# Patient Record
Sex: Male | Born: 1995 | Race: White | Hispanic: No | Marital: Single | Smoking: Current every day smoker
Health system: Southern US, Community
[De-identification: ages and names within clinical notes are randomized; demographics above are authoritative.]

---
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
## 2016-11-10 ENCOUNTER — Encounter (HOSPITAL_COMMUNITY): Payer: Self-pay | Admitting: *Deleted

## 2016-11-10 ENCOUNTER — Emergency Department (HOSPITAL_COMMUNITY)
Admission: EM | Admit: 2016-11-10 | Discharge: 2016-11-10 | Disposition: A | Discharge status: Home / Self Care | Payer: No Typology Code available for payment source | Attending: Emergency Medicine | Admitting: Emergency Medicine

## 2016-11-10 DIAGNOSIS — S161XXA Strain of muscle, fascia and tendon at neck level, initial encounter: Secondary | ICD-10-CM | POA: Insufficient documentation

## 2016-11-10 DIAGNOSIS — Y999 Unspecified external cause status: Secondary | ICD-10-CM | POA: Diagnosis not present

## 2016-11-10 DIAGNOSIS — Y92410 Unspecified street and highway as the place of occurrence of the external cause: Secondary | ICD-10-CM | POA: Diagnosis not present

## 2016-11-10 DIAGNOSIS — S199XXA Unspecified injury of neck, initial encounter: Secondary | ICD-10-CM | POA: Diagnosis present

## 2016-11-10 DIAGNOSIS — F17210 Nicotine dependence, cigarettes, uncomplicated: Secondary | ICD-10-CM | POA: Diagnosis not present

## 2016-11-10 DIAGNOSIS — Y939 Activity, unspecified: Secondary | ICD-10-CM | POA: Diagnosis not present

## 2016-11-10 DIAGNOSIS — S39012A Strain of muscle, fascia and tendon of lower back, initial encounter: Secondary | ICD-10-CM | POA: Insufficient documentation

## 2016-11-10 MED ORDER — IBUPROFEN 800 MG PO TABS: 800.0000 mg | ORAL_TABLET | Freq: Three times a day (TID) | ORAL | 0 refills | Status: DC

## 2016-11-10 MED ORDER — CYCLOBENZAPRINE HCL 10 MG PO TABS: 10.0000 mg | ORAL_TABLET | Freq: Three times a day (TID) | ORAL | 0 refills | Status: DC | PRN
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
## 2017-12-08 ENCOUNTER — Encounter (HOSPITAL_COMMUNITY): Payer: Self-pay | Admitting: Emergency Medicine

## 2017-12-08 ENCOUNTER — Other Ambulatory Visit: Payer: Self-pay

## 2017-12-08 ENCOUNTER — Emergency Department (HOSPITAL_COMMUNITY)
Admission: EM | Admit: 2017-12-08 | Discharge: 2017-12-09 | Disposition: A | Discharge status: Home / Self Care | Payer: Self-pay | Attending: Emergency Medicine | Admitting: Emergency Medicine

## 2017-12-08 DIAGNOSIS — J40 Bronchitis, not specified as acute or chronic: Secondary | ICD-10-CM | POA: Insufficient documentation

## 2017-12-08 DIAGNOSIS — F17210 Nicotine dependence, cigarettes, uncomplicated: Secondary | ICD-10-CM | POA: Insufficient documentation

## 2017-12-08 MED ORDER — PREDNISONE 20 MG PO TABS
40.0000 mg | ORAL_TABLET | Freq: Once | ORAL | Status: AC
  Administered 2017-12-08: 40 mg via ORAL
  Filled 2017-12-08: qty 2

## 2017-12-08 MED ORDER — ALBUTEROL SULFATE HFA 108 (90 BASE) MCG/ACT IN AERS
2.0000 | INHALATION_SPRAY | Freq: Once | RESPIRATORY_TRACT | Status: AC
  Administered 2017-12-08: 2 via RESPIRATORY_TRACT
  Filled 2017-12-08: qty 6.7

## 2017-12-08 MED ORDER — ALBUTEROL SULFATE (2.5 MG/3ML) 0.083% IN NEBU
2.5000 mg | INHALATION_SOLUTION | Freq: Once | RESPIRATORY_TRACT | Status: AC
  Administered 2017-12-08: 2.5 mg via RESPIRATORY_TRACT
  Filled 2017-12-08: qty 3

## 2017-12-08 MED ORDER — DIPHENHYDRAMINE HCL 12.5 MG/5ML PO ELIX
12.5000 mg | ORAL_SOLUTION | Freq: Once | ORAL | Status: AC
  Administered 2017-12-08: 12.5 mg via ORAL
  Filled 2017-12-08: qty 5

## 2017-12-08 NOTE — ED Triage Notes (Signed)
Pt states since chasing dog earlier tonight he has been sob.

## 2017-12-08 NOTE — ED Notes (Signed)
Pt reporting he feels better 30 secs after giving him his medication. Pt states he is ready to go home.

## 2017-12-08 NOTE — ED Notes (Signed)
Pt states he has been coughing up sputum over the last week.

## 2017-12-08 NOTE — ED Provider Notes (Signed)
[facility] EMERGENCY DEPARTMENT Provider Note   CSN: [id number] Arrival date & time: 12/08/17  2219     History   Chief Complaint Chief Complaint  Patient presents with  . Shortness of Breath    HPI George Mueller is a 21 y.o. male.  Patient is a 21 year old male who presents to the emergency department with cough and sensation of shortness of breath.  Patient states that he has been doing a lot of coughing over the past week.  Today he was chasing the dog and after chasing the dog he felt severely short of breath.  Upon his arrival to the emergency department he states that he felt as though he might pass out, but did not pass out.  He denies coughing up any blood.  He denies any high fever or chills.  He is not had any recent injury or trauma to the chest.  He has no diagnosed lung related issues.  It is of note that he is a smoker.      History reviewed. No pertinent past medical history.  Patient Active Problem List   Diagnosis Date Noted  . Closed fracture distal radius and ulna 07/24/2013  . CLOS FRACTURE MID/PROXIMAL PHALANX/PHALANG HAND 04/30/2010    History reviewed. No pertinent surgical history.     Home Medications    Prior to Admission medications   Medication Sig Start Date End Date Taking? Authorizing Provider  cyclobenzaprine (FLEXERIL) 10 MG tablet Take 1 tablet (10 mg total) by mouth 3 (three) times daily as needed. 11/10/16   Triplett, Babette Relicammy, PA-C  HYDROcodone-acetaminophen (NORCO/VICODIN) 5-325 MG per tablet 1 tab PO q6 hours prn pain 07/15/13   Samuel JesterMcManus, Kathleen, DO  ibuprofen (ADVIL,MOTRIN) 800 MG tablet Take 1 tablet (800 mg total) by mouth 3 (three) times daily. 11/10/16   Pauline Ausriplett, Tammy, PA-C    Family History No family history on file.  Social History Social History   Tobacco Use  . Smoking status: Current Every Day Smoker    Packs/day: 1.00    Types: Cigarettes  . Smokeless tobacco: Never Used  Substance Use Topics  .  Alcohol use: No  . Drug use: No     Allergies   Patient has no known allergies.   Review of Systems Review of Systems  Constitutional: Negative for activity change.       All ROS Neg except as noted in HPI  HENT: Positive for congestion. Negative for nosebleeds.   Eyes: Negative for photophobia and discharge.  Respiratory: Positive for cough, shortness of breath and wheezing.   Cardiovascular: Negative for chest pain and palpitations.  Gastrointestinal: Negative for abdominal pain and blood in stool.  Genitourinary: Negative for dysuria, frequency and hematuria.  Musculoskeletal: Negative for arthralgias, back pain and neck pain.  Skin: Negative.   Neurological: Negative for dizziness, seizures and speech difficulty.  Psychiatric/Behavioral: Negative for confusion and hallucinations.     Physical Exam Updated Vital Signs BP 138/79   Pulse (!) 115   Temp 98 F (36.7 C)   Resp (!) 22   Ht 5\' 7"  (1.702 m)   Wt 68 kg (150 lb)   SpO2 93%   BMI 23.49 kg/m   Physical Exam  Constitutional: He is oriented to person, place, and time. He appears well-developed and well-nourished.  Non-toxic appearance.  HENT:  Head: Normocephalic.  Right Ear: Tympanic membrane and external ear normal.  Left Ear: Tympanic membrane and external ear normal.  Nasal congestion present.  Eyes: EOM and  lids are normal. Pupils are equal, round, and reactive to light.  Neck: Normal range of motion. Neck supple. Carotid bruit is not present.  Cardiovascular: Normal rate, regular rhythm, normal heart sounds, intact distal pulses and normal pulses.  Pulmonary/Chest: No respiratory distress. He has wheezes.  Abdominal: Soft. Bowel sounds are normal. There is no tenderness. There is no guarding.  Musculoskeletal: Normal range of motion.  Lymphadenopathy:       Head (right side): No submandibular adenopathy present.       Head (left side): No submandibular adenopathy present.    He has no cervical  adenopathy.  Neurological: He is alert and oriented to person, place, and time. He has normal strength. No cranial nerve deficit or sensory deficit.  Skin: Skin is warm and dry.  Psychiatric: He has a normal mood and affect. His speech is normal.  Nursing note and vitals reviewed.    ED Treatments / Results  Labs (all labs ordered are listed, but only abnormal results are displayed) Labs Reviewed - No data to display  EKG  EKG Interpretation None       Radiology No results found.  Procedures Procedures (including critical care time)  Medications Ordered in ED Medications - No data to display   Initial Impression / Assessment and Plan / ED Course  I have reviewed the triage vital signs and the nursing notes.  Pertinent labs & imaging results that were available during my care of the patient were reviewed by me and considered in my medical decision making (see chart for details).       Final Clinical Impressions(s) / ED Diagnoses MDM Patient's pulse oximetry was 93% upon admission to the emergency department.  After nebulizer treatment, the patient has been ranging 97-100%.  Patient states that he feels much better after nebulizer treatments.  Patient is ambulatory in the room without problem and without feeling short of breath.  Patient will use albuterol inhaler 2 puffs every 4 hours.  He will use Decadron 2 times daily. Keflex qid also ordered.  Patient is to see his primary physician or return to the emergency department if any changes, problems, or concerns.   Final diagnoses:  Bronchitis    ED Discharge Orders        Ordered    cephALEXin (KEFLEX) 500 MG capsule  4 times daily     12/09/17 0014    dexamethasone (DECADRON) 4 MG tablet  2 times daily with meals     12/09/17 0014       Ivery QualeBryant, Linnea Todisco, PA-C 12/09/17 0015    Vanetta MuldersZackowski, Scott, MD 12/16/17 1525

## 2017-12-09 MED ORDER — CEPHALEXIN 500 MG PO CAPS: 500.0000 mg | ORAL_CAPSULE | Freq: Four times a day (QID) | ORAL | 0 refills | Status: DC

## 2017-12-09 MED ORDER — DEXAMETHASONE 4 MG PO TABS: 4.0000 mg | ORAL_TABLET | Freq: Two times a day (BID) | ORAL | 0 refills | Status: DC

## 2017-12-09 NOTE — Discharge Instructions (Signed)
Your oxygen level has improved after nebulizer treatments here in the emergency department.  Your examination also shows improvement of wheezing.  Your examination suggests acute bronchitis.  Please stop smoking.  Please increase fluids.  Wash hands frequently.  Use albuterol 2 puffs every 4 hours.  Use Decadron 2 times daily with food.  Use Keflex with breakfast, lunch, dinner, and at bedtime.  Please see your primary physician or return to the emergency department if not improving.

## 2019-09-28 ENCOUNTER — Other Ambulatory Visit: Payer: Self-pay

## 2019-09-28 ENCOUNTER — Emergency Department (HOSPITAL_COMMUNITY): Payer: Self-pay

## 2019-09-28 ENCOUNTER — Emergency Department (HOSPITAL_COMMUNITY)
Admission: EM | Admit: 2019-09-28 | Discharge: 2019-09-28 | Disposition: A | Discharge status: Home / Self Care | Payer: Self-pay | Attending: Emergency Medicine | Admitting: Emergency Medicine

## 2019-09-28 ENCOUNTER — Encounter (HOSPITAL_COMMUNITY): Payer: Self-pay

## 2019-09-28 DIAGNOSIS — W51XXXA Accidental striking against or bumped into by another person, initial encounter: Secondary | ICD-10-CM | POA: Insufficient documentation

## 2019-09-28 DIAGNOSIS — Y9383 Activity, rough housing and horseplay: Secondary | ICD-10-CM | POA: Insufficient documentation

## 2019-09-28 DIAGNOSIS — S161XXA Strain of muscle, fascia and tendon at neck level, initial encounter: Secondary | ICD-10-CM | POA: Insufficient documentation

## 2019-09-28 DIAGNOSIS — Y999 Unspecified external cause status: Secondary | ICD-10-CM | POA: Insufficient documentation

## 2019-09-28 DIAGNOSIS — F17210 Nicotine dependence, cigarettes, uncomplicated: Secondary | ICD-10-CM | POA: Insufficient documentation

## 2019-09-28 DIAGNOSIS — S0211BA Type I occipital condyle fracture, left side, initial encounter for closed fracture: Secondary | ICD-10-CM | POA: Insufficient documentation

## 2019-09-28 DIAGNOSIS — Y929 Unspecified place or not applicable: Secondary | ICD-10-CM | POA: Insufficient documentation

## 2019-09-28 MED ORDER — KETOROLAC TROMETHAMINE 30 MG/ML IJ SOLN
30.0000 mg | Freq: Once | INTRAMUSCULAR | Status: AC
  Administered 2019-09-28: 05:00:00 30 mg via INTRAMUSCULAR
  Filled 2019-09-28: qty 1

## 2019-09-28 MED ORDER — METHOCARBAMOL 750 MG PO TABS: 750.0000 mg | ORAL_TABLET | Freq: Three times a day (TID) | ORAL | 0 refills | Status: DC | PRN

## 2019-09-28 MED ORDER — DIAZEPAM 5 MG PO TABS
5.0000 mg | ORAL_TABLET | Freq: Once | ORAL | Status: AC
  Administered 2019-09-28: 5 mg via ORAL
  Filled 2019-09-28: qty 1

## 2019-09-28 NOTE — ED Triage Notes (Signed)
Pt states he was goofing around with his girlfriend last night approx 2330, states he felt a "tear" in the left side of his neck and now with pain when he tries to turn his head to the left.  Pt states he can press on the muscle in the left side of his neck and get some relief.

## 2019-09-28 NOTE — ED Provider Notes (Signed)
[facility] EMERGENCY DEPARTMENT Provider Note   CSN: [id number] Arrival date & time: 09/28/19  0416   Time seen 4:45 AM  History   Chief Complaint Chief Complaint  Patient presents with  . Neck Pain    HPI George Mueller is a 23 y.o. male.     HPI patient states about 11:30 PM he was wrestling with his girlfriend and he states she tackled him and he tensed up and he heard a crack or a tearing noise in the left side of his neck.  He states since then he is unable to move his head because of pain in his left neck.  He is holding his head looking to the right.  He states movement looking up or down or specially to the left is very painful.  He denies any numbness or tingling of his extremities.  He denies any change in his eyesight.  He denies any difficulty swallowing or breathing.  He states he is never had any neck injuries before.  PCP Patient, No Pcp Per   History reviewed. No pertinent past medical history.  Patient Active Problem List   Diagnosis Date Noted  . Closed fracture distal radius and ulna 07/24/2013  . CLOS FRACTURE MID/PROXIMAL PHALANX/PHALANG HAND 04/30/2010    History reviewed. No pertinent surgical history.      Home Medications    Prior to Admission medications   Medication Sig Start Date End Date Taking? Authorizing Provider  cephALEXin (KEFLEX) 500 MG capsule Take 1 capsule (500 mg total) by mouth 4 (four) times daily. 12/09/17   Lily Kocher, PA-C  cyclobenzaprine (FLEXERIL) 10 MG tablet Take 1 tablet (10 mg total) by mouth 3 (three) times daily as needed. 11/10/16   Triplett, Tammy, PA-C  dexamethasone (DECADRON) 4 MG tablet Take 1 tablet (4 mg total) by mouth 2 (two) times daily with a meal. 12/09/17   Lily Kocher, PA-C  HYDROcodone-acetaminophen (NORCO/VICODIN) 5-325 MG per tablet 1 tab PO q6 hours prn pain 07/15/13   Francine Graven, DO  ibuprofen (ADVIL,MOTRIN) 800 MG tablet Take 1 tablet (800 mg total) by mouth 3 (three) times  daily. 11/10/16   Kem Parkinson, PA-C    Family History No family history on file.  Social History Social History   Tobacco Use  . Smoking status: Current Every Day Smoker    Packs/day: 1.00    Types: Cigarettes  . Smokeless tobacco: Never Used  Substance Use Topics  . Alcohol use: No  . Drug use: No  Employed in [place]   Patient has no known allergies.   Review of Systems Review of Systems  All other systems reviewed and are negative.    Physical Exam Updated Vital Signs BP (!) 136/94 (BP Location: Left Arm)   Pulse 85   Temp 98.1 F (36.7 C) (Oral)   Resp 18   Ht 5\' 6"  (1.676 m)   Wt 72.6 kg   SpO2 100%   BMI 25.82 kg/m   Physical Exam Vitals signs and nursing note reviewed.  Constitutional:      General: He is in acute distress.     Appearance: Normal appearance.  HENT:     Head: Normocephalic and atraumatic.     Right Ear: External ear normal.     Left Ear: External ear normal.  Eyes:     Extraocular Movements: Extraocular movements intact.     Conjunctiva/sclera: Conjunctivae normal.     Pupils: Pupils are equal, round, and reactive to  light.  Neck:     Comments: Patient is nontender to palpation in his midline cervical spine from what I can tell.  He does indicate he is tender on the left paraspinous muscles where it attaches to the skull.  He is nontender to palpation over the SCM.  He has good carotid pulses.  Patient keeps his head turned to the right.  There is no obvious swelling or bruising seen. Musculoskeletal: Normal range of motion.  Skin:    General: Skin is warm and dry.  Neurological:     General: No focal deficit present.     Mental Status: He is alert and oriented to person, place, and time.     Cranial Nerves: No cranial nerve deficit.  Psychiatric:        Mood and Affect: Mood normal.        Behavior: Behavior normal.        Thought Content: Thought content normal.      ED Treatments / Results  Labs (all  labs ordered are listed, but only abnormal results are displayed) Labs Reviewed - No data to display  EKG None  Radiology Ct Cervical Spine Wo Contrast  Result Date: 09/28/2019 CLINICAL DATA:  Neck pain, wrestling, pop in left neck. Pain on left. EXAM: CT CERVICAL SPINE WITHOUT CONTRAST TECHNIQUE: Multidetector CT imaging of the cervical spine was performed without intravenous contrast. Multiplanar CT image reconstructions were also generated. COMPARISON:  None. FINDINGS: Alignment: Likely positional levocurvature and rightward rotation of head. Preservation of the normal cervical lordosis without traumatic listhesis. No abnormal facet widening. Otherwise normal alignment of the craniocervical and atlantoaxial articulations. Skull base and vertebrae: There is a linear lucency through the left occipital condyle suspicious for a nondisplaced fracture. Small lucency of the right occipital condyle has an appearance more compatible of a vascular channel. No other acute fracture or suspicious osseous lesion Soft tissues and spinal canal: No pre or paravertebral fluid or swelling. No visible canal hematoma. Disc levels: No significant central canal or foraminal stenosis identified within the imaged levels of the spine. Upper chest: No acute abnormality in the upper chest or imaged lung apices. Other: None IMPRESSION: 1. Linear lucency through the left occipital condyle suspicious for a nondisplaced fracture. Consider cervical stabilization and imaging with MRI. 2. Small lucency of the right occipital condyle has an appearance more compatible with a vascular channel. Electronically Signed   By: Kreg Shropshire M.D.   On: 09/28/2019 05:36    Procedures Procedures (including critical care time)  Medications Ordered in ED Medications  ketorolac (TORADOL) 30 MG/ML injection 30 mg (30 mg Intramuscular Given 09/28/19 0510)  diazepam (VALIUM) tablet 5 mg (5 mg Oral Given 09/28/19 0510)     Initial Impression /  Assessment and Plan / ED Course  I have reviewed the triage vital signs and the nursing notes.  Pertinent labs & imaging results that were available during my care of the patient were reviewed by me and considered in my medical decision making (see chart for details).        Consideration was made for carotid artery injury, however he does not have any neurological complaints.  I will start with a regular cervical spine CT scan.  He was given Toradol IM and Valium orally, IV not available due to [place] shortage.  5:36 AM radiologist called about patient's CT, he recommends cervical collar and getting MRI of the cervical spine.  5:40 AM I went to see patient and assisted his  nurse placing him in a [place] collar. He is now holding his head midline. We discussed need for MRI   Patient left a change of shift with Dr Denton LankSteinl to get results of his MRI and his disposition.  Final Clinical Impressions(s) / ED Diagnoses   Final diagnoses:  Closed Anderson-Montesano type I fracture of left occipital condyle, initial encounter (HCC)    Disposition pending  Devoria AlbeIva Balthazar Dooly, MD, Concha PyoFACEP    Ashaad Gaertner, MD 09/28/19 [phone]

## 2019-09-28 NOTE — ED Provider Notes (Addendum)
Signed out to d/c home after mri back.   Mri with no acute fx or ligamentous injury.  Pt comfortable appearing, nad. No midline/spine tenderness.  Pt currently appears stable for d/c.          Lajean Saver, MD 09/28/19 [phone]

## 2019-09-28 NOTE — Discharge Instructions (Addendum)
It was our pleasure to provide your ER care today - we hope that you feel better.  Take ibuprofen as need for pain. You may also take robaxin as need for muscle spasm/pain - no driving for the next 6 hours or when taking robaxin.  As we discussed, we can see a small area of contusion/sprain in your C2/C3 region, but no acute fracture is noted.   Follow up with primary care doctor in 1-2 weeks if symptoms fail to improve/resolve.  Return to ER if worse, new symptoms, fevers, worsening or intractable pain, numbness/weakness, or other concern.

## 2019-09-28 NOTE — ED Notes (Addendum)
Pt caught video recording this RN on SnapChat while giving IM toradol. Told pt that video recording was not allowed in the hospital, to which he stated he didn't care. Security called and video is deleted and pt is educated on the no recording policy. EDP aware.

## 2019-12-22 ENCOUNTER — Other Ambulatory Visit: Payer: Self-pay

## 2019-12-22 ENCOUNTER — Ambulatory Visit
Admission: EM | Admit: 2019-12-22 | Discharge: 2019-12-22 | Disposition: A | Discharge status: Home / Self Care | Payer: Self-pay | Attending: Emergency Medicine | Admitting: Emergency Medicine

## 2019-12-22 DIAGNOSIS — Z20822 Contact with and (suspected) exposure to covid-19: Secondary | ICD-10-CM

## 2019-12-22 NOTE — ED Triage Notes (Signed)
Pt presents to UC for a covid test. Pt denies symptoms. Pt has had positive covid exposure.

## 2019-12-22 NOTE — ED Provider Notes (Signed)
[facility]   [id number] 12/22/19 Arrival Time: 1445   CC: COVID exposure  SUBJECTIVE: History from: patient.  George Mueller is a 24 y.o. male who presents for COVID testing.  COVID exposure at work yesterday.  Denies recent travel.  Denies aggravating or alleviating symptoms.  Denies previous COVID infection.   Denies fever, chills, fatigue, nasal congestion, rhinorrhea, sore throat, cough, SOB, wheezing, chest pain, nausea, vomiting, changes in bowel or bladder habits.     ROS: As per HPI.  All other pertinent ROS negative.     History reviewed. No pertinent past medical history. History reviewed. No pertinent surgical history. No Known Allergies No current facility-administered medications on file prior to encounter.   Current Outpatient Medications on File Prior to Encounter  Medication Sig Dispense Refill  . HYDROcodone-acetaminophen (NORCO/VICODIN) 5-325 MG per tablet 1 tab PO q6 hours prn pain 6 tablet 0   Social History   Socioeconomic History  . Marital status: Single    Spouse name: Not on file  . Number of children: Not on file  . Years of education: Not on file  . Highest education level: Not on file  Occupational History  . Not on file  Tobacco Use  . Smoking status: Current Every Day Smoker    Packs/day: 1.00    Types: Cigarettes  . Smokeless tobacco: Never Used  Substance and Sexual Activity  . Alcohol use: No  . Drug use: No  . Sexual activity: Not on file  Other Topics Concern  . Not on file  Social History Narrative  . Not on file   Social Determinants of Health   Financial Resource Strain:   . Difficulty of Paying Living Expenses: Not on file  Food Insecurity:   . Worried About Programme researcher, broadcasting/film/video in the Last Year: Not on file  . Ran Out of Food in the Last Year: Not on file  Transportation Needs:   . Lack of Transportation (Medical): Not on file  . Lack of Transportation (Non-Medical): Not on file  Physical Activity:   .  Days of Exercise per Week: Not on file  . Minutes of Exercise per Session: Not on file  Stress:   . Feeling of Stress : Not on file  Social Connections:   . Frequency of Communication with Friends and Family: Not on file  . Frequency of Social Gatherings with Friends and Family: Not on file  . Attends Religious Services: Not on file  . Active Member of Clubs or Organizations: Not on file  . Attends Banker Meetings: Not on file  . Marital Status: Not on file  Intimate Partner Violence:   . Fear of Current or Ex-Partner: Not on file  . Emotionally Abused: Not on file  . Physically Abused: Not on file  . Sexually Abused: Not on file   Family History  Problem Relation Age of Onset  . Healthy Mother   . Healthy Father     OBJECTIVE:  Vitals:   12/22/19 1532  BP: 127/75  Pulse: 83  Resp: 16  Temp: 98.3 F (36.8 C)  TempSrc: Oral  SpO2: 98%     General appearance: alert; well-appearing, nontoxic; speaking in full sentences and tolerating own secretions HEENT: NCAT; Ears: EACs ceruminous; Eyes: PERRL.  EOM grossly intact. Sinuses: nontender; Nose: nares patent without rhinorrhea, Throat: oropharynx clear, tonsils non erythematous or enlarged, uvula midline  Neck: supple without LAD Lungs: unlabored respirations, symmetrical air entry; cough: absent; no respiratory  distress; CTAB Heart: regular rate and rhythm.   Skin: warm and dry Psychological: alert and cooperative; normal mood and affect  ASSESSMENT & PLAN:  1. Exposure to COVID-19 virus    COVID testing ordered.  It will take between 5-7 days for test results.  Someone will contact you regarding abnormal results.    In the meantime: You should remain isolated in your home for 10 days from symptom onset AND greater than 72 hours after symptoms resolution (absence of fever without the use of fever-reducing medication and improvement in respiratory symptoms), whichever is longer OR 14 days from exposure Get  plenty of rest and push fluids Use OTC zyrtec for nasal congestion, runny nose, and/or sore throat Use OTC flonase for nasal congestion and runny nose Use medications daily for symptom relief Use OTC medications like ibuprofen or tylenol as needed fever or pain Call or go to the ED if you have any new or worsening symptoms such as fever, cough, shortness of breath, chest tightness, chest pain, turning blue, changes in mental status, etc...   Reviewed expectations re: course of current medical issues. Questions answered. Outlined signs and symptoms indicating need for more acute intervention. Patient verbalized understanding. After Visit Summary given.         Lestine Box, PA-C 12/22/19 1630

## 2019-12-22 NOTE — Discharge Instructions (Signed)

## 2019-12-23 LAB — NOVEL CORONAVIRUS, NAA: SARS-CoV-2, NAA: NOT DETECTED

## 2020-05-12 IMAGING — MR MR CERVICAL SPINE W/O CM
4 of 5 series · 17 of 48 positions shown · non-contrast
Comparison: CT scan dated 09/28/2019

CLINICAL DATA: Acute neck pain while wrestling last night. Abnormal
CT scan on 09/28/2019.

EXAM:
MRI CERVICAL SPINE WITHOUT CONTRAST
TECHNIQUE: Multiplanar, multisequence MR imaging of the cervical spine was
performed. No intravenous contrast was administered.

[Series 3: T1 · sagittal · 3.0mm · 0.70mm/px · 3 of 15 slices shown]
[im 3/15]
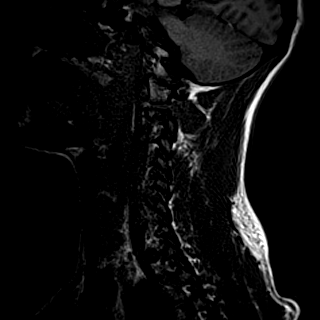
[im 8/15]
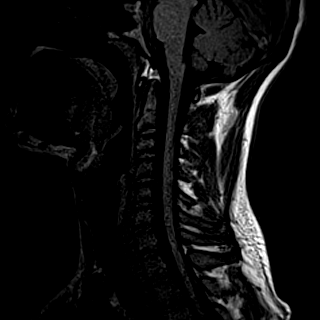
[im 12/15]
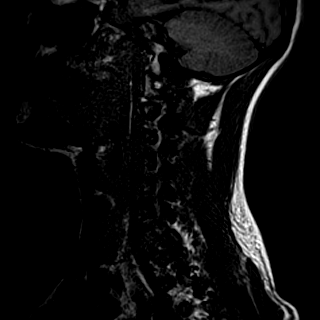

[Series 4: STIR · sagittal · 3.0mm · 0.36mm/px · 3 of 15 slices shown]
[im 3/15]
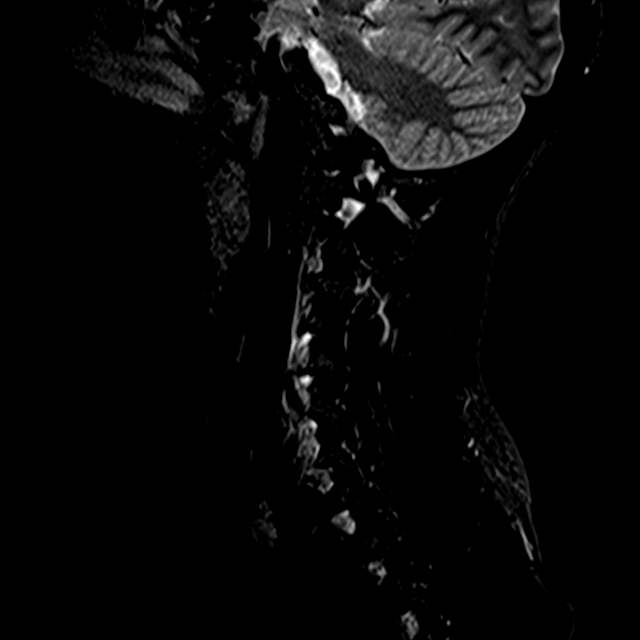
[im 8/15]
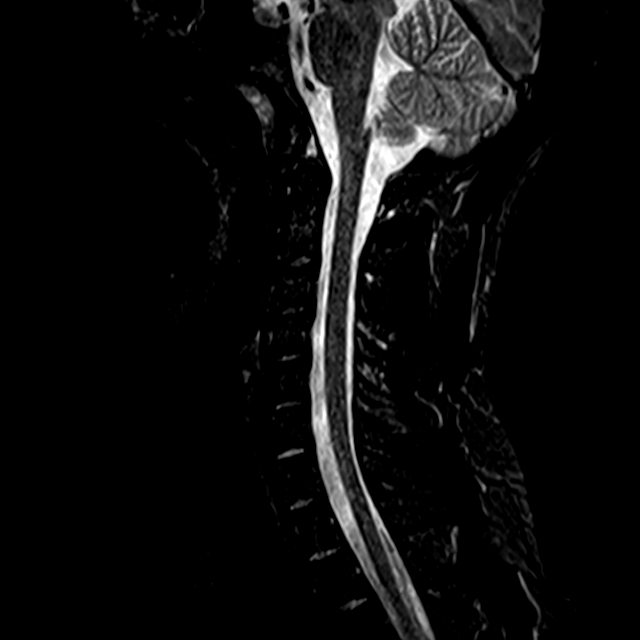
[im 12/15]
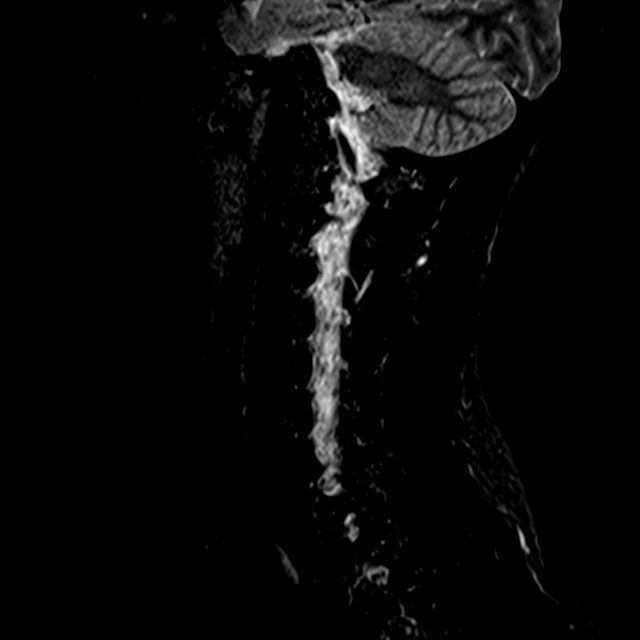

[Series 5: T2 · sagittal · 3.0mm · 0.36mm/px · 6 of 15 slices shown (1 of 2)]
[im 1/15]
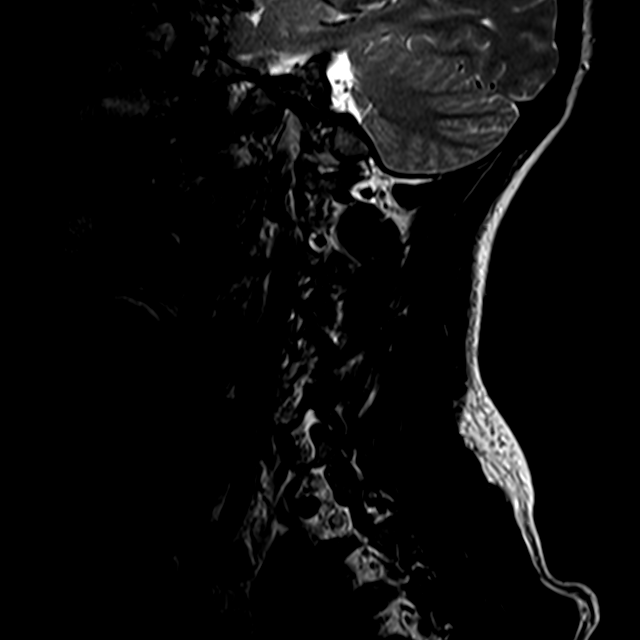
[im 3/15]
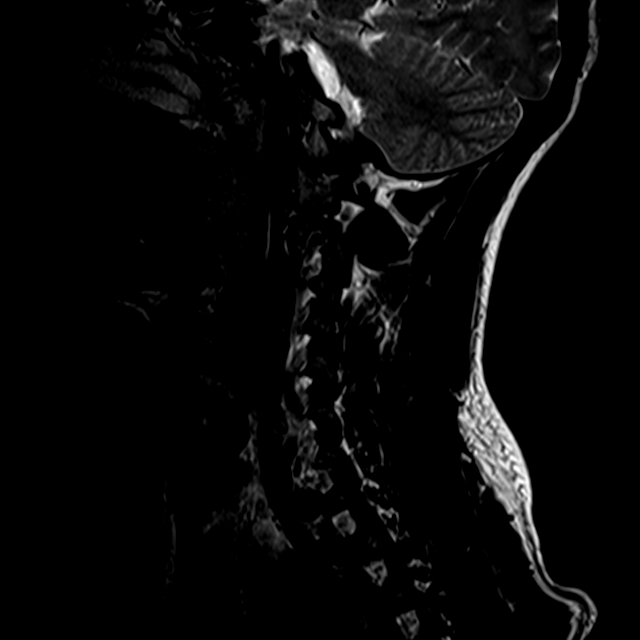
[im 6/15]
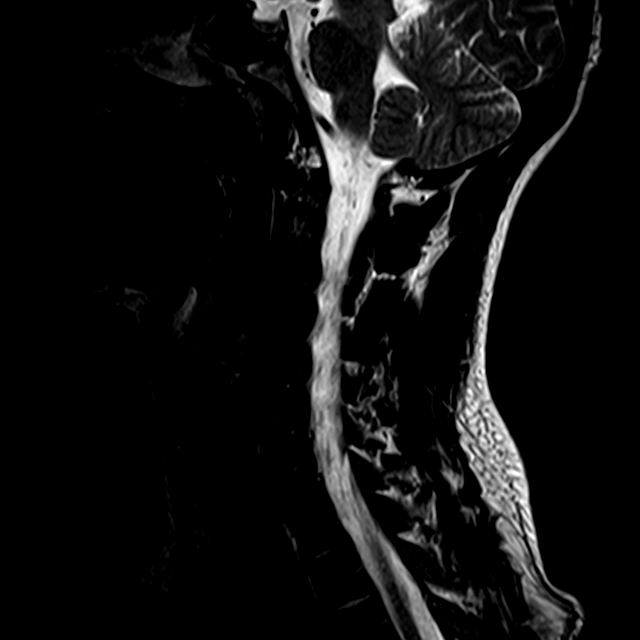
[im 9/15]
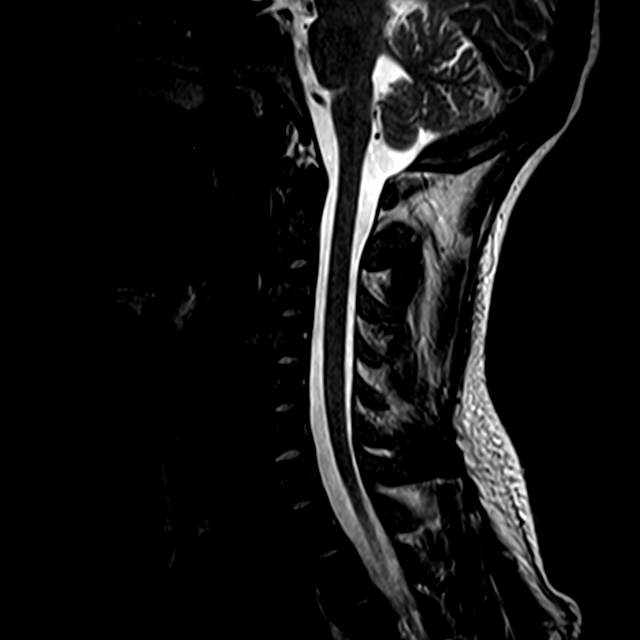
[im 12/15]
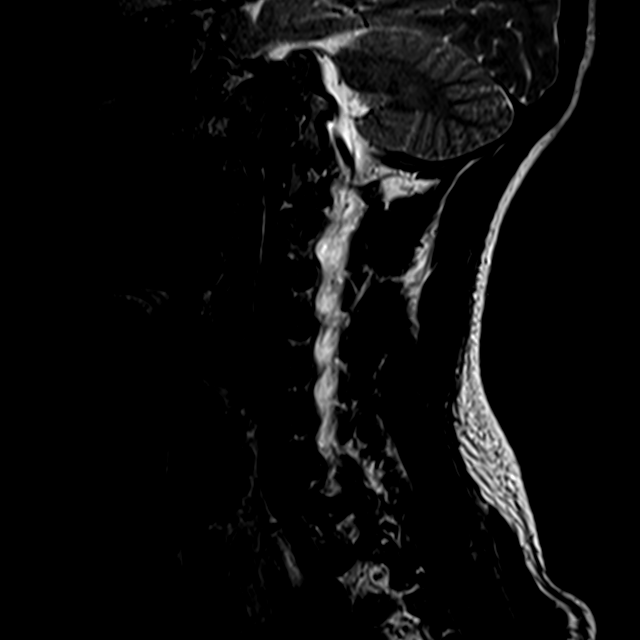
[im 15/15]
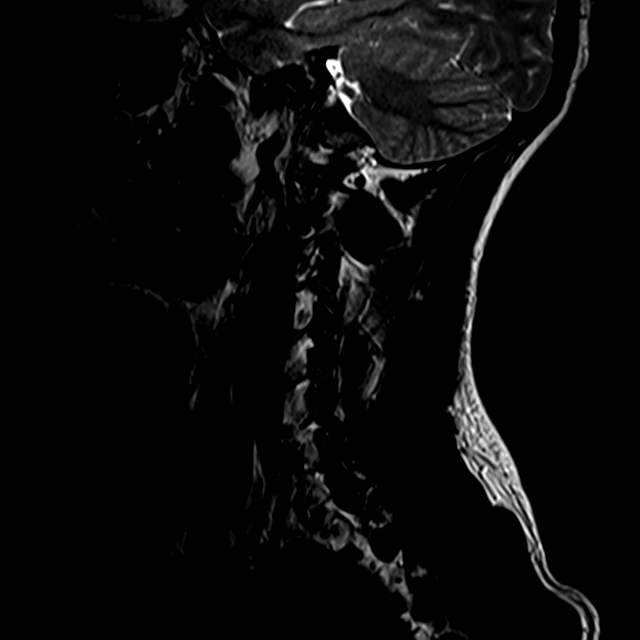

[Series 6: T2 · axial · 3.0mm · 0.33mm/px · z∈[-93,-6]mm · 5 of 33 slices shown (2 of 2)]
[im 1/33]
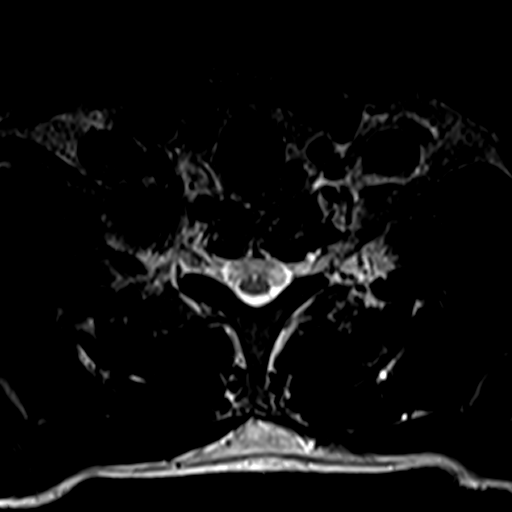
[im 5/33]
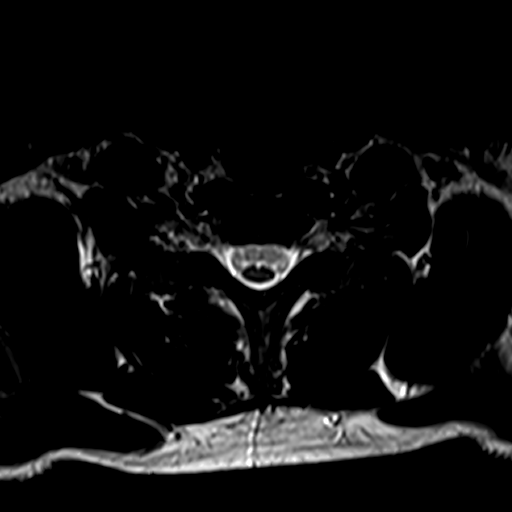
[im 10/33]
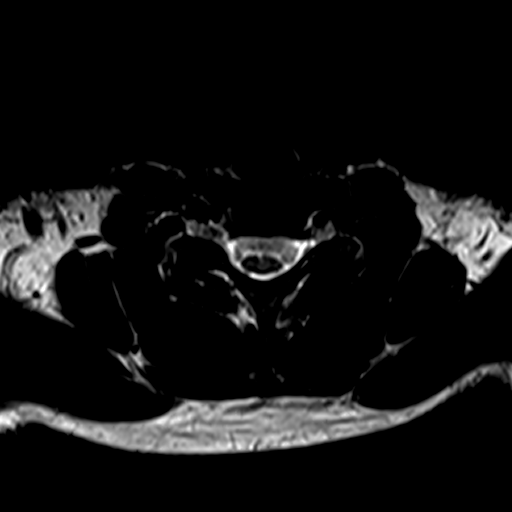
[im 18/33]
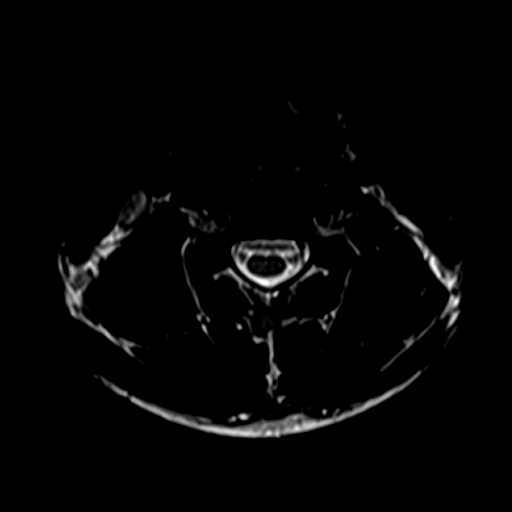
[im 28/33]
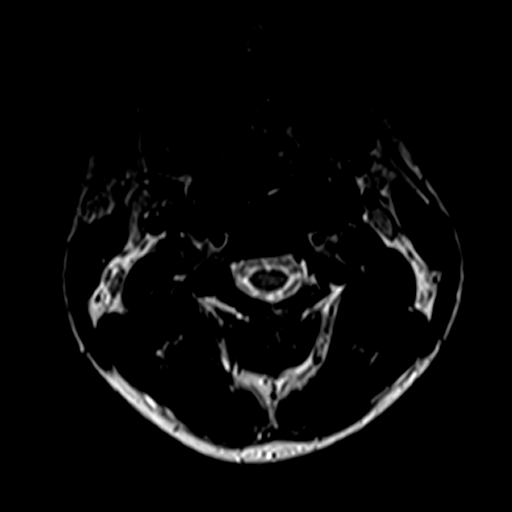

[17 of 48 positions shown; findings below may reference images not displayed]

FINDINGS: Alignment: Physiologic.

Vertebrae: No fracture, evidence of discitis, or bone lesion.
Specifically, no evidence of edema in the left occipital condyle in
the area of concern on CT scan. I feel this lucency on the CT scan
therefore represents a normal vascular channel.

Cord: Normal signal and morphology.

Posterior Fossa, vertebral arteries, paraspinal tissues: Negative.

Disc levels:

C2-3: The disc is normal. There is a tiny amount of fluid or edema
along the left uncinate process. This level is otherwise normal.

C3-4 through T2-3: Normal.
IMPRESSION: 1. No evidence of a fracture of the left occipital condyle as
suggested on the prior CT scan of the cervical spine. I think this
represents a benign vascular channel.
2. Focal edema or fluid along the left uncinate process of C3 at the
C2-3 level, nonspecific but possibly posttraumatic. I doubt this has
any clinical significance.

## 2022-09-09 ENCOUNTER — Other Ambulatory Visit: Payer: Self-pay

## 2022-09-09 ENCOUNTER — Emergency Department (HOSPITAL_COMMUNITY)
Admission: EM | Admit: 2022-09-09 | Discharge: 2022-09-09 | Disposition: A | Discharge status: Home / Self Care | Payer: Self-pay | Attending: Emergency Medicine | Admitting: Emergency Medicine

## 2022-09-09 ENCOUNTER — Encounter (HOSPITAL_COMMUNITY): Payer: Self-pay

## 2022-09-09 DIAGNOSIS — H5789 Other specified disorders of eye and adnexa: Secondary | ICD-10-CM | POA: Insufficient documentation

## 2022-09-09 DIAGNOSIS — Z5321 Procedure and treatment not carried out due to patient leaving prior to being seen by health care provider: Secondary | ICD-10-CM | POA: Insufficient documentation

## 2022-09-09 NOTE — ED Notes (Signed)
Pt called x 2 from lobby to reassess v/s, no answer

## 2022-09-09 NOTE — ED Triage Notes (Signed)
Pt presents to ED with complaints of right eye redness and drainage. Pt states he took his contact out not sure if he scrapped his eye or not.

## 2022-09-09 NOTE — ED Notes (Signed)
Pt called for room, no answer.

## 2024-06-03 ENCOUNTER — Emergency Department
Admission: EM | Admit: 2024-06-03 | Discharge: 2024-06-03 | Disposition: A | Discharge status: Home / Self Care | Payer: Self-pay | Attending: Emergency Medicine | Admitting: Emergency Medicine

## 2024-06-03 ENCOUNTER — Encounter: Payer: Self-pay | Admitting: Emergency Medicine

## 2024-06-03 ENCOUNTER — Emergency Department: Payer: Self-pay

## 2024-06-03 ENCOUNTER — Other Ambulatory Visit: Payer: Self-pay

## 2024-06-03 DIAGNOSIS — W232XXA Caught, crushed, jammed or pinched between a moving and stationary object, initial encounter: Secondary | ICD-10-CM | POA: Insufficient documentation

## 2024-06-03 DIAGNOSIS — S62645A Nondisplaced fracture of proximal phalanx of left ring finger, initial encounter for closed fracture: Secondary | ICD-10-CM | POA: Insufficient documentation

## 2024-06-03 MED ORDER — HYDROCODONE-ACETAMINOPHEN 5-325 MG PO TABS: 1.0000 | ORAL_TABLET | Freq: Three times a day (TID) | ORAL | 0 refills | Status: AC | PRN

## 2024-06-03 NOTE — ED Provider Notes (Signed)
 [facility] Emergency Department Provider Note     Event Date/Time   First MD Initiated Contact with Patient 06/03/24 1147     (approximate)   History   Hand Injury   HPI  George Mueller is a 28 y.o. male with a noncontributory medical history, presents to the ED endorsing left hand pain and swelling of the left ring finger.  He injured his hand initially on Wednesday about 5 days prior to arrival when his finger got caught and pulled in a rope on a [employer].  He thought initially he had just sprained the finger, and pain and swelling was minimal.  Patient reports 2 days later he reinjured the finger, and now presents with significant swelling and pain to the finger.  He denies any injury at this time.  Physical Exam   Triage Vital Signs: ED Triage Vitals  Encounter Vitals Group     BP 06/03/24 1141 (!) 144/95     Girls Systolic BP Percentile --      Girls Diastolic BP Percentile --      Boys Systolic BP Percentile --      Boys Diastolic BP Percentile --      Pulse Rate 06/03/24 1141 88     Resp 06/03/24 1141 16     Temp 06/03/24 1141 97.7 F (36.5 C)     Temp Source 06/03/24 1141 Oral     SpO2 06/03/24 1141 100 %     Weight 06/03/24 1142 174 lb (78.9 kg)     Height 06/03/24 1142 5' 6 (1.676 m)     Head Circumference --      Peak Flow --      Pain Score 06/03/24 1142 8     Pain Loc --      Pain Education --      Exclude from Growth Chart --     Most recent vital signs: Vitals:   06/03/24 1141  BP: (!) 144/95  Pulse: 88  Resp: 16  Temp: 97.7 F (36.5 C)  SpO2: 100%    General Awake, no distress. NAD HEENT NCAT. PERRL. EOMI. No rhinorrhea. Mucous membranes are moist.  CV:  Good peripheral perfusion.  RESP:  Normal effort.  MSK:  Left hand with decreased composite fist secondary to soft tissue swelling at the fourth digit.  Patient with tenderness to palpation over the proximal phalanx of the fourth digit. NEURO: Cranial nerves  II to XII grossly intact.  Normal gross sensation noted.   ED Results / Procedures / Treatments   Labs (all labs ordered are listed, but only abnormal results are displayed) Labs Reviewed - No data to display   EKG   RADIOLOGY  I personally viewed and evaluated these images as part of my medical decision making, as well as reviewing the written report by the radiologist.  ED Provider Interpretation: Comminuted oblique fracture of the proximal phalanx of the fourth digit  No results found.   PROCEDURES:  Critical Care performed: No  .Splint Application  Date/Time: 06/03/2024 12:41 PM  Performed by: Loyd Candida LULLA Aldona, PA-C Authorized by: Loyd Candida LULLA Aldona, PA-C   Consent:    Consent obtained:  Verbal   Consent given by:  Patient   Risks, benefits, and alternatives were discussed: yes     Risks discussed:  Pain Universal protocol:    Imaging studies available: yes     Immediately prior to procedure a time out was called: yes  Patient identity confirmed:  Verbally with patient Pre-procedure details:    Distal neurologic exam:  Normal   Distal perfusion: distal pulses strong   Procedure details:    Location:  Hand   Hand location:  L hand   Splint type:  Finger   Supplies:  Cotton padding, elastic bandage and fiberglass Post-procedure details:    Distal neurologic exam:  Normal   Distal perfusion: distal pulses strong     Procedure completion:  Tolerated well, no immediate complications   Post-procedure imaging: not applicable   Comments:     Volar finger splint placed from the distal 4th and 5th digits to the palmar wrist for support of this fourth digit fracture  MEDICATIONS ORDERED IN ED: Medications - No data to display   IMPRESSION / MDM / ASSESSMENT AND PLAN / ED COURSE  I reviewed the triage vital signs and the nursing notes.                              Differential diagnosis includes, but is not limited to, hand fracture, contusion,  finger dislocation, hematoma, contusion, tendinitis  Patient's presentation is most consistent with acute complicated illness / injury requiring diagnostic workup.  Patient's diagnosis is consistent with a closed minimally displaced proximal phalanx fracture of the left ring finger.  X-ray images by my interpretation, and the fracture morphology.  No red flags on exam.  Patient is neurovascularly intact.  Placed in a volar finger splint for support and protection.  Patient will be discharged home with prescriptions for hydrocodone . Patient is to follow up with Ortho as suggested, as needed or otherwise directed. Patient is given ED precautions to return to the ED for any worsening or new symptoms.   FINAL CLINICAL IMPRESSION(S) / ED DIAGNOSES   Final diagnoses:  Closed nondisplaced fracture of proximal phalanx of left ring finger, initial encounter     Rx / DC Orders   ED Discharge Orders          Ordered    HYDROcodone -acetaminophen  (NORCO/VICODIN) 5-325 MG tablet  3 times daily PRN        06/03/24 1232             Note:  This document was prepared using Dragon voice recognition software and may include unintentional dictation errors.    Loyd Candida LULLA Aldona, PA-C 06/04/24 1850    Suzanne Kirsch, MD 06/04/24 [id number]

## 2024-06-03 NOTE — Discharge Instructions (Addendum)
 You have a fracture to the base bone of your left ring finger.  Keep the splint in place until you are evaluated by orthopedics.  Keep the splint clean and dry.  Apply ice over the splint to help reduce swelling.  Take OTC Tylenol  for nondrowsy pain relief.  Follow-up with Ortho as suggested.

## 2024-06-03 NOTE — ED Triage Notes (Addendum)
 Pt via POV c/o left hand and left ring finger pain after he injured his hand on Wednesday, 5 days PTA. He reinjured it yesterday and the swelling and pain he had after the initial event got worse. Pain rated 8/10 constant. No meds PTA. Swelling noted to posterior left hand with limited ROM.
